# Patient Record
Sex: Male | Born: 1990 | Race: Black or African American | Hispanic: No | Marital: Single | Smoking: Current every day smoker
Health system: Southern US, Community
[De-identification: ages and names within clinical notes are randomized; demographics above are authoritative.]

## PROBLEM LIST (undated history)

## (undated) DIAGNOSIS — J45909 Unspecified asthma, uncomplicated: Secondary | ICD-10-CM

## (undated) HISTORY — PX: AMPUTATION FINGER: SHX6594

---
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
## 2011-03-22 ENCOUNTER — Emergency Department: Payer: Self-pay | Admitting: Unknown Physician Specialty

## 2015-03-25 ENCOUNTER — Emergency Department
Admit: 2015-03-25 | Disposition: A | Discharge status: Home / Self Care | Payer: Self-pay | Admitting: Emergency Medicine

## 2015-12-31 ENCOUNTER — Emergency Department
Admission: EM | Admit: 2015-12-31 | Discharge: 2015-12-31 | Disposition: A | Discharge status: Home / Self Care | Payer: 59 | Attending: Emergency Medicine | Admitting: Emergency Medicine

## 2015-12-31 ENCOUNTER — Encounter: Payer: Self-pay | Admitting: *Deleted

## 2015-12-31 ENCOUNTER — Emergency Department: Payer: 59

## 2015-12-31 DIAGNOSIS — J45901 Unspecified asthma with (acute) exacerbation: Secondary | ICD-10-CM | POA: Insufficient documentation

## 2015-12-31 DIAGNOSIS — F17200 Nicotine dependence, unspecified, uncomplicated: Secondary | ICD-10-CM | POA: Diagnosis not present

## 2015-12-31 DIAGNOSIS — J069 Acute upper respiratory infection, unspecified: Secondary | ICD-10-CM

## 2015-12-31 DIAGNOSIS — R0989 Other specified symptoms and signs involving the circulatory and respiratory systems: Secondary | ICD-10-CM

## 2015-12-31 DIAGNOSIS — J989 Respiratory disorder, unspecified: Secondary | ICD-10-CM

## 2015-12-31 DIAGNOSIS — R0602 Shortness of breath: Secondary | ICD-10-CM | POA: Diagnosis present

## 2015-12-31 HISTORY — DX: Unspecified asthma, uncomplicated: J45.909

## 2015-12-31 MED ORDER — IPRATROPIUM-ALBUTEROL 0.5-2.5 (3) MG/3ML IN SOLN
3.0000 mL | Freq: Once | RESPIRATORY_TRACT | Status: AC
  Administered 2015-12-31: 3 mL via RESPIRATORY_TRACT
  Filled 2015-12-31: qty 3

## 2015-12-31 MED ORDER — ALBUTEROL SULFATE HFA 108 (90 BASE) MCG/ACT IN AERS: 2.0000 | INHALATION_SPRAY | Freq: Four times a day (QID) | RESPIRATORY_TRACT | Status: AC | PRN

## 2015-12-31 MED ORDER — GUAIFENESIN-CODEINE 100-10 MG/5ML PO SOLN: 10.0000 mL | ORAL | Status: DC | PRN

## 2015-12-31 NOTE — ED Provider Notes (Signed)
[facility] Emergency Department Provider Note  ____________________________________________  Time seen: Approximately 10:28 AM  I have reviewed the triage vital signs and the nursing notes.   HISTORY  Chief Complaint Shortness of Breath   HPI Devin Ingram is a 25 y.o. male who presents with 5 days of shortness of breath and chest pressure. He reports that on Friday he developed new symptoms along with wheezing, congestion, chills, and cough. He reports that the cough exacerbates his chest pain. He rates his chest pain as 5/10. He denies ear pain, sore throat, or fever. He also reports that Sunday night he had a productive cough with blood-tinged sputum. He has taken cold and flu medicine and used his son's albuterol inhaler with minimal improvement.  Past Medical History  Diagnosis Date  . Asthma     There are no active problems to display for this patient.   History reviewed. No pertinent past surgical history.  Current Outpatient Rx  Name  Route  Sig  Dispense  Refill  . albuterol (PROVENTIL HFA;VENTOLIN HFA) 108 (90 Base) MCG/ACT inhaler   Inhalation   Inhale 2 puffs into the lungs every 6 (six) hours as needed for wheezing or shortness of breath.   1 Inhaler   2   . guaiFENesin-codeine 100-10 MG/5ML syrup   Oral   Take 10 mLs by mouth every 4 (four) hours as needed for cough.   180 mL   0     Allergies Review of patient's allergies indicates no known allergies.  No family history on file.  Social History Social History  Substance Use Topics  . Smoking status: Current Every Day Smoker  . Smokeless tobacco: None  . Alcohol Use: No    Review of Systems Constitutional: No fever. Positive chills Eyes: No visual changes. ENT: No sore throat. Positive rhinorrhea, positive nasal congestion, no otalgia, no otorrhea Cardiovascular: Positive chest pain. Respiratory: Positive shortness of breath. Positive cough. Positive sputum  production with blood tinged. Gastrointestinal: No abdominal pain.  No nausea, no vomiting.  No diarrhea.  No constipation. Genitourinary: Negative for dysuria. Negative frequency or hesitancy. Musculoskeletal: Muscle pain in lower abdomen with cough Neurological: Negative for headaches 10-point ROS otherwise negative.  ____________________________________________   PHYSICAL EXAM:  VITAL SIGNS: ED Triage Vitals  Enc Vitals Group     BP 12/31/15 0928 139/78 mmHg     Pulse Rate 12/31/15 0928 65     Resp 12/31/15 0928 24     Temp 12/31/15 0928 97.6 F (36.4 C)     Temp Source 12/31/15 0928 Oral     SpO2 12/31/15 0928 96 %     Weight 12/31/15 0928 135 lb (61.236 kg)     Height 12/31/15 0928  (1.702 m)     Head Cir --      Peak Flow --      Pain Score 12/31/15 0929 5     Pain Loc --      Pain Edu? --      Excl. in GC? --     Constitutional: Alert and oriented. Well appearing and in no acute distress. Eyes: Conjunctivae are normal. PERRL. EOMI. Head: Atraumatic. Nose: No congestion/rhinnorhea. Mouth/Throat: Mucous membranes are moist.  Oropharynx non-erythematous.  Hematological/Lymphatic/Immunilogical: No cervical lymphadenopathy. Cardiovascular: Normal rate, regular rhythm. Grossly normal heart sounds.  Good peripheral circulation. Respiratory: Normal respiratory effort.  No retractions. Rare expiratory rhonchi. Productive cough noted while in exam room Gastrointestinal: Soft and nontender. No distention. No abdominal  bruits. No CVA tenderness. Musculoskeletal: No lower extremity tenderness nor edema.   Neurologic:  Normal speech and language. No gross focal neurologic deficits are appreciated.  Skin:  Skin is warm, dry and intact. No rash noted. Psychiatric: Mood and affect are normal. Speech and behavior are normal.  ____________________________________________   LABS (all labs ordered are listed, but only abnormal results are displayed)  Labs Reviewed - No data  to display ____________________________________________  EKG No acute STEMI  ____________________________________________  RADIOLOGY Lungs are clear, heart size is normal, no pneumothorax or pleural effusion. No focal bony abnormality.  ____________________________________________   PROCEDURES  Procedure(s) performed: None  Critical Care performed: No  ____________________________________________   INITIAL IMPRESSION / ASSESSMENT AND PLAN / ED COURSE  Pertinent labs & imaging results that were available during my care of the patient were reviewed by me and considered in my medical decision making (see chart for details).  Chest x-ray shows no sign of infiltrates or opacities, no acute STEMI on EKG. Patient will be treated for a viral upper respiratory tract infection, appropriately educated, and discharged home. Patient was given a DuoNeb treatment while in the ED with improvement. Rx given for albuterol inhaler, Robitussin-AC and a work excuse 1 day.  Patient voices no other emergency medical complaints at this time.  ____________________________________________   FINAL CLINICAL IMPRESSION(S) / ED DIAGNOSES  Final diagnoses:  Reactive airway disease that is not asthma  URI, acute      Evangeline Dakin, PA-C 12/31/15 1125  Arnaldo Natal, MD 12/31/15 [phone]

## 2015-12-31 NOTE — ED Notes (Signed)
Pt states SOB and cough, productive, states it has been going on since this weekend, states hx of asthma but does not use inhalers, also smoker

## 2017-02-18 ENCOUNTER — Emergency Department
Admission: EM | Admit: 2017-02-18 | Discharge: 2017-02-18 | Disposition: A | Discharge status: Home / Self Care | Payer: 59 | Attending: Student in an Organized Health Care Education/Training Program | Admitting: Student in an Organized Health Care Education/Training Program

## 2017-02-18 ENCOUNTER — Encounter: Payer: Self-pay | Admitting: Emergency Medicine

## 2017-02-18 ENCOUNTER — Emergency Department: Payer: 59

## 2017-02-18 DIAGNOSIS — S0990XA Unspecified injury of head, initial encounter: Secondary | ICD-10-CM | POA: Diagnosis present

## 2017-02-18 DIAGNOSIS — F17200 Nicotine dependence, unspecified, uncomplicated: Secondary | ICD-10-CM | POA: Insufficient documentation

## 2017-02-18 DIAGNOSIS — Y990 Civilian activity done for income or pay: Secondary | ICD-10-CM | POA: Insufficient documentation

## 2017-02-18 DIAGNOSIS — J45909 Unspecified asthma, uncomplicated: Secondary | ICD-10-CM | POA: Diagnosis not present

## 2017-02-18 DIAGNOSIS — W228XXA Striking against or struck by other objects, initial encounter: Secondary | ICD-10-CM | POA: Insufficient documentation

## 2017-02-18 DIAGNOSIS — Z79899 Other long term (current) drug therapy: Secondary | ICD-10-CM | POA: Diagnosis not present

## 2017-02-18 DIAGNOSIS — S0093XA Contusion of unspecified part of head, initial encounter: Secondary | ICD-10-CM | POA: Diagnosis not present

## 2017-02-18 DIAGNOSIS — Y929 Unspecified place or not applicable: Secondary | ICD-10-CM | POA: Insufficient documentation

## 2017-02-18 DIAGNOSIS — Y9389 Activity, other specified: Secondary | ICD-10-CM | POA: Diagnosis not present

## 2017-02-18 NOTE — ED Notes (Signed)
Per patient, patient does not want to file under worker's comp. He wants to file under own insurance.

## 2017-02-18 NOTE — ED Provider Notes (Signed)
[facility] Emergency Department Provider Note   ____________________________________________   None    (approximate)  I have reviewed the triage vital signs and the nursing notes.   HISTORY  Chief Complaint Head Injury    HPI Devin Ingram is a 26 y.o. male patient complaining left. Pain behind left ear secondary to blunt trauma at work yesterday. States hit his head on a metal pipe at work but denies LOC. Patient denies vision disturbance vertical.. Patient denies nausea vomiting. No palliative measures taken for this complaint. Patient rates his pain as a 4/10. Patient described a pain as "achy". Patient state incident occurred approximately 24 hours ago.   Past Medical History:  Diagnosis Date  . Asthma     There are no active problems to display for this patient.   History reviewed. No pertinent surgical history.  Prior to Admission medications   Medication Sig Start Date End Date Taking? Authorizing Provider  albuterol (PROVENTIL HFA;VENTOLIN HFA) 108 (90 Base) MCG/ACT inhaler Inhale 2 puffs into the lungs every 6 (six) hours as needed for wheezing or shortness of breath. 12/31/15   Charmayne Sheerharles M Beers, PA-C  guaiFENesin-codeine 100-10 MG/5ML syrup Take 10 mLs by mouth every 4 (four) hours as needed for cough. 12/31/15   Evangeline Dakinharles M Beers, PA-C    Allergies Patient has no known allergies.  No family history on file.  Social History Social History  Substance Use Topics  . Smoking status: Current Every Day Smoker  . Smokeless tobacco: Not on file  . Alcohol use No    Review of Systems Constitutional: No fever/chills Eyes: No visual changes. ENT: No sore throat. Cardiovascular: Denies chest pain. Respiratory: Denies shortness of breath. Gastrointestinal: No abdominal pain.  No nausea, no vomiting.  No diarrhea.  No constipation. Genitourinary: Negative for dysuria. Musculoskeletal: Negative for back pain. Skin: Negative for  rash. Neurological: Negative for headaches, focal weakness or numbness. ____________________________________________   PHYSICAL EXAM:  VITAL SIGNS: ED Triage Vitals  Enc Vitals Group     BP 02/18/17 1141 135/60     Pulse Rate 02/18/17 1141 69     Resp 02/18/17 1141 17     Temp 02/18/17 1141 97.5 F (36.4 C)     Temp Source 02/18/17 1141 Oral     SpO2 02/18/17 1141 100 %     Weight 02/18/17 1142 140 lb (63.5 kg)     Height 02/18/17 1142 5\' 6"  (1.676 m)     Head Circumference --      Peak Flow --      Pain Score 02/18/17 1142 4     Pain Loc --      Pain Edu? --      Excl. in GC? --     Constitutional: Alert and oriented. Well appearing and in no acute distress. Eyes: Conjunctivae are normal. PERRL. EOMI. Head: Atraumatic. Nose: No congestion/rhinnorhea. Mouth/Throat: Mucous membranes are moist.  Oropharynx non-erythematous. Neck: No stridor.  No cervical spine tenderness to palpation. Hematological/Lymphatic/Immunilogical: No cervical lymphadenopathy. Cardiovascular: Normal rate, regular rhythm. Grossly normal heart sounds.  Good peripheral circulation. Respiratory: Normal respiratory effort.  No retractions. Lungs CTAB. Gastrointestinal: Soft and nontender. No distention. No abdominal bruits. No CVA tenderness. Musculoskeletal: No lower extremity tenderness nor edema.  No joint effusions. Neurologic:  Normal speech and language. No gross focal neurologic deficits are appreciated. No gait instability. Skin:  Skin is warm, dry and intact. No rash noted. Psychiatric: Mood and affect are normal. Speech and behavior  are normal.  ____________________________________________   LABS (all labs ordered are listed, but only abnormal results are displayed)  Labs Reviewed - No data to  display ____________________________________________  EKG   ____________________________________________  RADIOLOGY   ____________________________________________   PROCEDURES  Procedure(s) performed: None  Procedures  Critical Care performed: No  ____________________________________________   INITIAL IMPRESSION / ASSESSMENT AND PLAN / ED COURSE  Pertinent labs & imaging results that were available during my care of the patient were reviewed by me and considered in my medical decision making (see chart for details).  Minor head injuries secondary to contusion. Discussed negative x-ray finding with patient. Patient given discharge care instructions. Patient given a work note for today. Advised follow-up family doctor this complaint persists.      ____________________________________________   FINAL CLINICAL IMPRESSION(S) / ED DIAGNOSES  Final diagnoses:  Head contusion  Minor head injury, initial encounter      NEW MEDICATIONS STARTED DURING THIS VISIT:  New Prescriptions   No medications on file     Note:  This document was prepared using Dragon voice recognition software and may include unintentional dictation errors.    Joni Reining, PA-C 02/18/17 1251    Willy Eddy, MD 02/18/17 1345

## 2017-02-18 NOTE — ED Notes (Signed)
Attempted to call patients employer, [employer], in regards to requirements of workers comp. [phone]. No answer at this time.

## 2017-02-18 NOTE — Discharge Instructions (Signed)
OTC Motrin as needed for Headache.

## 2017-02-18 NOTE — ED Triage Notes (Signed)
Patient presents to ED via POV with c/o head injury that he sustained at work yesterday. Patient states he hit his head on some metal at work yesterday. Denies LOC or taking blood thinners. Patient is c/o left head pain behind ear. Patient requesting to file WC. Patient neurologically intact. No deficits noted. A&O x4. GCS 15.

## 2017-03-04 ENCOUNTER — Emergency Department
Admission: EM | Admit: 2017-03-04 | Discharge: 2017-03-04 | Disposition: A | Discharge status: Home / Self Care | Payer: 59 | Attending: Emergency Medicine | Admitting: Emergency Medicine

## 2017-03-04 ENCOUNTER — Encounter: Payer: Self-pay | Admitting: Medical Oncology

## 2017-03-04 DIAGNOSIS — Z79899 Other long term (current) drug therapy: Secondary | ICD-10-CM | POA: Diagnosis not present

## 2017-03-04 DIAGNOSIS — J45909 Unspecified asthma, uncomplicated: Secondary | ICD-10-CM | POA: Diagnosis not present

## 2017-03-04 DIAGNOSIS — Y92410 Unspecified street and highway as the place of occurrence of the external cause: Secondary | ICD-10-CM | POA: Diagnosis not present

## 2017-03-04 DIAGNOSIS — S39012A Strain of muscle, fascia and tendon of lower back, initial encounter: Secondary | ICD-10-CM | POA: Diagnosis not present

## 2017-03-04 DIAGNOSIS — F17200 Nicotine dependence, unspecified, uncomplicated: Secondary | ICD-10-CM | POA: Diagnosis not present

## 2017-03-04 DIAGNOSIS — Y999 Unspecified external cause status: Secondary | ICD-10-CM | POA: Diagnosis not present

## 2017-03-04 DIAGNOSIS — Y9389 Activity, other specified: Secondary | ICD-10-CM | POA: Insufficient documentation

## 2017-03-04 DIAGNOSIS — S3992XA Unspecified injury of lower back, initial encounter: Secondary | ICD-10-CM | POA: Diagnosis present

## 2017-03-04 MED ORDER — METHOCARBAMOL 500 MG PO TABS: 500.0000 mg | ORAL_TABLET | Freq: Four times a day (QID) | ORAL | 0 refills | Status: DC

## 2017-03-04 MED ORDER — IBUPROFEN 600 MG PO TABS: 600.0000 mg | ORAL_TABLET | Freq: Three times a day (TID) | ORAL | 0 refills | Status: DC | PRN
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
## 2017-03-04 NOTE — Discharge Instructions (Signed)
Follow-up with [facility] if any continued problems. Use ice or heat to your back as needed for comfort. Ibuprofen 600 mg 3 times a day with food. Robaxin one 4 times a day for the next 3 days if needed for muscle spasms. Robaxin can cause drowsiness so do not take this medication drive or operate machinery.
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
## 2017-10-01 ENCOUNTER — Emergency Department
Admission: EM | Admit: 2017-10-01 | Discharge: 2017-10-01 | Disposition: A | Discharge status: Home / Self Care | Payer: 59 | Attending: Emergency Medicine | Admitting: Emergency Medicine

## 2017-10-01 DIAGNOSIS — Y998 Other external cause status: Secondary | ICD-10-CM | POA: Insufficient documentation

## 2017-10-01 DIAGNOSIS — Y9389 Activity, other specified: Secondary | ICD-10-CM | POA: Insufficient documentation

## 2017-10-01 DIAGNOSIS — J45909 Unspecified asthma, uncomplicated: Secondary | ICD-10-CM | POA: Insufficient documentation

## 2017-10-01 DIAGNOSIS — S46912A Strain of unspecified muscle, fascia and tendon at shoulder and upper arm level, left arm, initial encounter: Secondary | ICD-10-CM

## 2017-10-01 DIAGNOSIS — X500XXA Overexertion from strenuous movement or load, initial encounter: Secondary | ICD-10-CM | POA: Insufficient documentation

## 2017-10-01 DIAGNOSIS — Y929 Unspecified place or not applicable: Secondary | ICD-10-CM | POA: Insufficient documentation

## 2017-10-01 DIAGNOSIS — F17200 Nicotine dependence, unspecified, uncomplicated: Secondary | ICD-10-CM | POA: Insufficient documentation

## 2017-10-01 MED ORDER — CYCLOBENZAPRINE HCL 5 MG PO TABS: 5.0000 mg | ORAL_TABLET | Freq: Three times a day (TID) | ORAL | 0 refills | Status: DC | PRN

## 2017-10-01 MED ORDER — HYDROCODONE-ACETAMINOPHEN 5-325 MG PO TABS: 1.0000 | ORAL_TABLET | Freq: Four times a day (QID) | ORAL | 0 refills | Status: DC | PRN

## 2017-10-01 MED ORDER — KETOROLAC TROMETHAMINE 30 MG/ML IJ SOLN
30.0000 mg | Freq: Once | INTRAMUSCULAR | Status: AC
  Administered 2017-10-01: 30 mg via INTRAMUSCULAR
  Filled 2017-10-01: qty 1

## 2017-10-01 MED ORDER — NAPROXEN 500 MG PO TABS: 500.0000 mg | ORAL_TABLET | Freq: Two times a day (BID) | ORAL | 0 refills | Status: DC

## 2017-10-01 MED ORDER — HYDROCODONE-ACETAMINOPHEN 5-325 MG PO TABS
1.0000 | ORAL_TABLET | Freq: Once | ORAL | Status: AC
  Administered 2017-10-01: 1 via ORAL
  Filled 2017-10-01: qty 1

## 2017-10-01 MED ORDER — CYCLOBENZAPRINE HCL 10 MG PO TABS
5.0000 mg | ORAL_TABLET | Freq: Once | ORAL | Status: AC
  Administered 2017-10-01: 5 mg via ORAL
  Filled 2017-10-01: qty 1

## 2017-10-01 NOTE — ED Notes (Signed)
Pt discharged to home.  Family member driving.  Discharge instructions reviewed.  Verbalized understanding.  No questions or concerns at this time.  Teach back verified.  Pt in NAD.  No items left in ED.   

## 2017-10-01 NOTE — ED Notes (Addendum)
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  Pt reports now that when he moves his next to the right he feels a shooting pain down his neck into the left side of his chest and left shoulder.  Pt denies pain when neck is in neutral position.  Pain is not worse on palpation.  Pt denies taking anything for pain at home since he hurt shoulder.  Pt denies using a heating pad, but states that he tried an ice pack.

## 2017-10-01 NOTE — ED Provider Notes (Signed)
[facility] Emergency Department Provider Note   ____________________________________________   First MD Initiated Contact with Patient 10/01/17 1121     (approximate)  I have reviewed the triage vital signs and the nursing notes.   HISTORY  Chief Complaint Arm Pain   HPI Devin FavreJeffrey D Ingram is a 26 y.o. male is here complaining of left shoulder painfor the past 2 weeks. Patient states that he was helping a friend by lifting him to "pop his back". He began having pain in his left shoulder after lifting this person. Patient states that pain increases with range of motion. He has not taken any over-the-counter medication but has been "dealing with pain". Patient denies any paresthesias to his extremity. He denies any previous injury to his arm.currently he rates pain as 10 over 10.  Past Medical History:  Diagnosis Date  . Asthma     There are no active problems to display for this patient.   History reviewed. No pertinent surgical history.  Prior to Admission medications   Medication Sig Start Date End Date Taking? Authorizing Provider  albuterol (PROVENTIL HFA;VENTOLIN HFA) 108 (90 Base) MCG/ACT inhaler Inhale 2 puffs into the lungs every 6 (six) hours as needed for wheezing or shortness of breath. 12/31/15   Beers, Charmayne Sheerharles M, PA-C  cyclobenzaprine (FLEXERIL) 5 MG tablet Take 1 tablet (5 mg total) by mouth 3 (three) times daily as needed for muscle spasms. 10/01/17   Tommi RumpsSummers, Annalei Friesz L, PA-C  HYDROcodone-acetaminophen (NORCO/VICODIN) 5-325 MG tablet Take 1 tablet by mouth every 6 (six) hours as needed for moderate pain. 10/01/17   Tommi RumpsSummers, Prisma Decarlo L, PA-C  naproxen (NAPROSYN) 500 MG tablet Take 1 tablet (500 mg total) by mouth 2 (two) times daily with a meal. 10/01/17   Tommi RumpsSummers, Lorina Duffner L, PA-C    Allergies Patient has no known allergies.  No family history on file.  Social History Social History  Substance Use Topics  . Smoking status: Current Every  Day Smoker  . Smokeless tobacco: Never Used  . Alcohol use No    Review of Systems Constitutional: No fever/chills Cardiovascular: Denies chest pain. Respiratory: Denies shortness of breath. Gastrointestinal:   No nausea, no vomiting.  Musculoskeletal: Negative for back pain.positive for left shoulder pain. Skin: Negative for rash. Neurological: Negative for headaches, focal weakness or numbness. ___________________________________________   PHYSICAL EXAM:  VITAL SIGNS: ED Triage Vitals  Enc Vitals Group     BP 10/01/17 1103 127/73     Pulse Rate 10/01/17 1103 61     Resp 10/01/17 1103 16     Temp 10/01/17 1103 98.1 F (36.7 C)     Temp Source 10/01/17 1103 Oral     SpO2 10/01/17 1103 99 %     Weight 10/01/17 1102 140 lb (63.5 kg)     Height 10/01/17 1102 5\' 8"  (1.727 m)     Head Circumference --      Peak Flow --      Pain Score 10/01/17 1112 10     Pain Loc --      Pain Edu? --      Excl. in GC? --    Constitutional: Alert and oriented. Well appearing and in no acute distress. Eyes: Conjunctivae are normal.  Head: Atraumatic. Neck: No stridor.  No tenderness on palpation cervical spine posteriorly. Range of motion is without restriction. Cardiovascular: Normal rate, regular rhythm. Grossly normal heart sounds.  Good peripheral circulation. Respiratory: Normal respiratory effort.  No retractions. Lungs CTAB. Musculoskeletal:  an examination of left shoulder there is no gross deformity noted. There is no soft tissue swelling, ecchymosis or abrasions. Range of motion is restricted in abduction only. There is no crepitus noted. There is also tenderness on palpation of the left trapezius muscle and slight rhomboid muscle tenderness. Good muscle strength bilaterally. Motor sensory function intact. Neurologic:  Normal speech and language. No gross focal neurologic deficits are appreciated.  Skin:  Skin is warm, dry and intact. No rash noted. Psychiatric: Mood and affect are  normal. Speech and behavior are normal.  ____________________________________________   LABS (all labs ordered are listed, but only abnormal results are displayed)  Labs Reviewed - No data to display   PROCEDURES  Procedure(s) performed: None  Procedures  Critical Care performed: No  ____________________________________________   INITIAL IMPRESSION / ASSESSMENT AND PLAN / ED COURSE   Patient was given Toradol 30 mg IM, Norco, and Flexeril 5 mg while in the department. Patient had no pain and increased range of motion prior to discharge. Patient was given a prescription for continued Flexeril, Norco, and naproxen for the next 3 days. Patient is also encouraged to use ice or heat to his shoulder as needed. He will follow-up with [facility] clinic if any continued problems.  ____________________________________________   FINAL CLINICAL IMPRESSION(S) / ED DIAGNOSES  Final diagnoses:  Muscle strain of left shoulder region, initial encounter      NEW MEDICATIONS STARTED DURING THIS VISIT:  Discharge Medication List as of 10/01/2017 12:50 PM    START taking these medications   Details  cyclobenzaprine (FLEXERIL) 5 MG tablet Take 1 tablet (5 mg total) by mouth 3 (three) times daily as needed for muscle spasms., Starting Sat 10/01/2017, Print    HYDROcodone-acetaminophen (NORCO/VICODIN) 5-325 MG tablet Take 1 tablet by mouth every 6 (six) hours as needed for moderate pain., Starting Sat 10/01/2017, Print    naproxen (NAPROSYN) 500 MG tablet Take 1 tablet (500 mg total) by mouth 2 (two) times daily with a meal., Starting Sat 10/01/2017, Print         Note:  This document was prepared using Dragon voice recognition software and may include unintentional dictation errors.    Tommi Rumps, PA-C 10/01/17 1326    Governor Rooks, MD 10/01/17 [phone]

## 2017-10-01 NOTE — ED Triage Notes (Signed)
Pt c/o left arm pain for past few weeks, reports injury while helping friend stretch back. Has not taken anything for pain relief. VS stable.

## 2017-10-01 NOTE — Discharge Instructions (Signed)
[facility] if any continued problems. Continue taking medication only as directed. Flexeril 5 mg, Norco, naproxen 500 mg. You may also use ice to your shoulder as needed for discomfort. Limited lifting with your left arm for the next 3-4 days.

## 2019-06-28 ENCOUNTER — Other Ambulatory Visit: Payer: Self-pay

## 2019-06-28 DIAGNOSIS — Z20822 Contact with and (suspected) exposure to covid-19: Secondary | ICD-10-CM

## 2019-07-01 LAB — NOVEL CORONAVIRUS, NAA: SARS-CoV-2, NAA: NOT DETECTED

## 2020-01-31 ENCOUNTER — Emergency Department
Admission: EM | Admit: 2020-01-31 | Discharge: 2020-01-31 | Disposition: A | Discharge status: Home / Self Care | Payer: Self-pay | Attending: Emergency Medicine | Admitting: Emergency Medicine

## 2020-01-31 ENCOUNTER — Other Ambulatory Visit: Payer: Self-pay

## 2020-01-31 ENCOUNTER — Encounter: Payer: Self-pay | Admitting: Emergency Medicine

## 2020-01-31 DIAGNOSIS — S61012A Laceration without foreign body of left thumb without damage to nail, initial encounter: Secondary | ICD-10-CM | POA: Insufficient documentation

## 2020-01-31 DIAGNOSIS — Y9389 Activity, other specified: Secondary | ICD-10-CM | POA: Insufficient documentation

## 2020-01-31 DIAGNOSIS — J45909 Unspecified asthma, uncomplicated: Secondary | ICD-10-CM | POA: Insufficient documentation

## 2020-01-31 DIAGNOSIS — Z87891 Personal history of nicotine dependence: Secondary | ICD-10-CM | POA: Insufficient documentation

## 2020-01-31 DIAGNOSIS — W260XXA Contact with knife, initial encounter: Secondary | ICD-10-CM | POA: Insufficient documentation

## 2020-01-31 DIAGNOSIS — Z23 Encounter for immunization: Secondary | ICD-10-CM | POA: Insufficient documentation

## 2020-01-31 DIAGNOSIS — Y999 Unspecified external cause status: Secondary | ICD-10-CM | POA: Insufficient documentation

## 2020-01-31 DIAGNOSIS — Y929 Unspecified place or not applicable: Secondary | ICD-10-CM | POA: Insufficient documentation

## 2020-01-31 MED ORDER — LIDOCAINE HCL (PF) 1 % IJ SOLN
INTRAMUSCULAR | Status: AC
  Administered 2020-01-31: 5 mL
  Filled 2020-01-31: qty 5

## 2020-01-31 MED ORDER — TETANUS-DIPHTH-ACELL PERTUSSIS 5-2.5-18.5 LF-MCG/0.5 IM SUSP
0.5000 mL | Freq: Once | INTRAMUSCULAR | Status: AC
  Administered 2020-01-31: 11:00:00 0.5 mL via INTRAMUSCULAR
  Filled 2020-01-31: qty 0.5

## 2020-01-31 MED ORDER — LIDOCAINE HCL (PF) 1 % IJ SOLN
5.0000 mL | Freq: Once | INTRAMUSCULAR | Status: AC
Start: 2020-01-31 — End: 2020-01-31
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
## 2020-01-31 NOTE — Discharge Instructions (Signed)
Follow discharge care instruction and wear splint for 3 to 5 days as needed. 

## 2020-01-31 NOTE — ED Triage Notes (Signed)
Patient to ER for c/o laceration to left thumb approx 0.5 inches with bleeding present. Patient states he was trying to open and fix a toy and cut finger with a knife.

## 2020-01-31 NOTE — ED Notes (Signed)
See triage note  Presents with laceration to left thumb  States he was trying to open a toy for his child   Knife slipped   Laceration to left thumb

## 2020-08-13 ENCOUNTER — Emergency Department (HOSPITAL_COMMUNITY)
Admission: EM | Admit: 2020-08-13 | Discharge: 2020-08-13 | Disposition: A | Discharge status: Home / Self Care | Payer: Self-pay | Attending: Emergency Medicine | Admitting: Emergency Medicine

## 2020-08-13 ENCOUNTER — Encounter (HOSPITAL_COMMUNITY): Payer: Self-pay | Admitting: Emergency Medicine

## 2020-08-13 DIAGNOSIS — Z23 Encounter for immunization: Secondary | ICD-10-CM | POA: Insufficient documentation

## 2020-08-13 DIAGNOSIS — L02411 Cutaneous abscess of right axilla: Secondary | ICD-10-CM | POA: Insufficient documentation

## 2020-08-13 DIAGNOSIS — Z87891 Personal history of nicotine dependence: Secondary | ICD-10-CM | POA: Insufficient documentation

## 2020-08-13 DIAGNOSIS — J45909 Unspecified asthma, uncomplicated: Secondary | ICD-10-CM | POA: Insufficient documentation

## 2020-08-13 MED ORDER — LIDOCAINE HCL (PF) 1 % IJ SOLN
5.0000 mL | Freq: Once | INTRAMUSCULAR | Status: DC
  Filled 2020-08-13: qty 5

## 2020-08-13 MED ORDER — TETANUS-DIPHTH-ACELL PERTUSSIS 5-2.5-18.5 LF-MCG/0.5 IM SUSP
0.5000 mL | Freq: Once | INTRAMUSCULAR | Status: AC
  Administered 2020-08-13: 0.5 mL via INTRAMUSCULAR
  Filled 2020-08-13: qty 0.5

## 2020-08-13 NOTE — ED Triage Notes (Signed)
Pt reports he has what he thought was an ingrown hair to R axilla, states that is has gotten much larger in the past few days. Some blood draining from area.

## 2020-08-13 NOTE — ED Provider Notes (Signed)
[facility] EMERGENCY DEPARTMENT Provider Note   CSN: [id number] Arrival date & time: 08/13/20  1227     History Chief Complaint  Patient presents with  . Abscess    Devin Ingram is a 29 y.o. male.  HPI    Patient with significant medical history of asthma presents with a abscess underneath his armpit on the right side.  Patient states he noticed a small ingrown hair 2 weeks ago. He remove the hair and a small boil appeared.  The boil got larger and larger and became more painful.  He noticed today there is some discharge coming from it describes as a yellow substance.  He denies ever experienced this before and denies any alleviating or aggravating factors.  Patient denies headache, fever, chills, shortness of breath, abdominal pain, nausea, vomiting, diarrhea, pedal edema.  Past Medical History:  Diagnosis Date  . Asthma     There are no problems to display for this patient.   Past Surgical History:  Procedure Laterality Date  . AMPUTATION FINGER         No family history on file.  Social History   Tobacco Use  . Smoking status: Former Games developer  . Smokeless tobacco: Never Used  Substance Use Topics  . Alcohol use: No  . Drug use: Not on file    Home Medications Prior to Admission medications   Medication Sig Start Date End Date Taking? Authorizing Provider  albuterol (PROVENTIL HFA;VENTOLIN HFA) 108 (90 Base) MCG/ACT inhaler Inhale 2 puffs into the lungs every 6 (six) hours as needed for wheezing or shortness of breath. 12/31/15   Beers, Charmayne Sheer, PA-C    Allergies    Patient has no known allergies.  Review of Systems   Review of Systems  Constitutional: Negative for chills and fever.  HENT: Negative for congestion, sore throat and tinnitus.   Eyes: Negative for visual disturbance.  Respiratory: Negative for shortness of breath.   Cardiovascular: Negative for chest pain.  Gastrointestinal: Negative for abdominal pain, diarrhea, nausea  and vomiting.  Genitourinary: Negative for enuresis.  Musculoskeletal: Negative for back pain.  Skin: Negative for rash.       Admits to a boil on the right side of his armpit.  Neurological: Negative for dizziness.  Hematological: Does not bruise/bleed easily.    Physical Exam Updated Vital Signs BP (!) 154/91 (BP Location: Right Arm)   Pulse 60   Temp 98.3 F (36.8 C) (Oral)   Resp 16   SpO2 99%   Physical Exam Vitals and nursing note reviewed.  Constitutional:      General: He is not in acute distress.    Appearance: Normal appearance. He is not ill-appearing or diaphoretic.  HENT:     Head: Normocephalic and atraumatic.     Nose: No congestion or rhinorrhea.  Eyes:     General: No scleral icterus.       Right eye: No discharge.        Left eye: No discharge.     Conjunctiva/sclera: Conjunctivae normal.  Pulmonary:     Effort: Pulmonary effort is normal. No respiratory distress.     Breath sounds: Normal breath sounds. No wheezing.  Musculoskeletal:     Cervical back: Neck supple.     Right lower leg: No edema.     Left lower leg: No edema.  Skin:    General: Skin is warm and dry.     Coloration: Skin is not jaundiced or pale.  Findings: Lesion present.     Comments: Skin exam was performed, there is a large 4 cm in diameter abscess noted on the right axilla.  It was nonerythematous, warm to the touch, purulent discharge noted.  It was tender to palpation, fluctuance felt no induration.  No signs of overlying cellulitis noted.  Neurological:     Mental Status: He is alert and oriented to person, place, and time.  Psychiatric:        Mood and Affect: Mood normal.       ED Results / Procedures / Treatments   Labs (all labs ordered are listed, but only abnormal results are displayed) Labs Reviewed - No data to display  EKG None  Radiology No results found.  Procedures .Marland KitchenIncision and Drainage  Date/Time: 08/13/2020 2:42 PM Performed by: Carroll Sage, PA-C Authorized by: Carroll Sage, PA-C   Consent:    Consent obtained:  Verbal   Consent given by:  Patient   Risks discussed:  Bleeding, incomplete drainage, pain, damage to other organs and infection   Alternatives discussed:  No treatment Location:    Type:  Abscess   Size:  4cm   Location:  Upper extremity   Upper extremity location:  Arm   Arm location:  R upper arm Pre-procedure details:    Skin preparation:  Betadine Anesthesia (see MAR for exact dosages):    Anesthesia method:  Local infiltration   Local anesthetic:  Lidocaine 1% w/o epi Procedure type:    Complexity:  Complex Procedure details:    Incision types:  Single straight   Incision depth:  Dermal   Scalpel blade:  11   Wound management:  Probed and deloculated and irrigated with saline   Drainage:  Purulent   Drainage amount:  Moderate   Wound treatment:  Wound left open   Packing materials:  1/2 in gauze   Amount 1/2":  4cm Post-procedure details:    Patient tolerance of procedure:  Tolerated well, no immediate complications   (including critical care time)  Medications Ordered in ED Medications  lidocaine (PF) (XYLOCAINE) 1 % injection 5 mL (has no administration in time range)  Tdap (BOOSTRIX) injection 0.5 mL (0.5 mLs Intramuscular Given 08/13/20 1436)    ED Course  I have reviewed the triage vital signs and the nursing notes.  Pertinent labs & imaging results that were available during my care of the patient were reviewed by me and considered in my medical decision making (see chart for details).    MDM Rules/Calculators/A&P                          Patient presents with abscess under his right axilla. Patient was alert and oriented did not appear to be in acute distress, vital signs reassuring. Patient's right axilla was evaluated, there is a 4 cm in diameter abscess noted with purulent discharge present. No erythema noted, tender to palpation, no signs of overlying infection  noted. Induration and fluctuance felt. Will recommend I&D as this would be the best treatment for this patient.  I&D was performed copious amount of purulent discharge was mechanically massaged out of abscess. Large void was packed with gauze and patient was instructed patient to follow-up in 48 hours to have it removed. Patient was given a tetanus shot as he does not member the last time he had one.  Low suspicion for deep tissue abscess or overlying cellulitis as there was no erythema or  edema noted around the abscess. After draining the abscess no fluctuance or indurations felt below or around the regional abscess. Low suspicion for systemic infection as patient was nontoxic-appearing, vital signs reassuring, no obvious source of infection noted on exam.  I suspect patient had an abscess that resulted from a ingrown hair, wound was I&D and packing was placed in void. Patient was given instructions to come back in 48 hours to have the gauze removed and for a wound check.  Patient was discussed with attending who agrees with assessment and plan. Patient was stable for discharge, no indication for hospital admissions. Patient was given at home care as well as strict return precautions. Patient verbalized understanding agreed to plan. Final Clinical Impression(s) / ED Diagnoses Final diagnoses:  Abscess of axilla, right    Rx / DC Orders ED Discharge Orders    None       Carroll Sage, PA-C 08/13/20 1609    Geoffery Lyons, MD 08/14/20 1515

## 2020-08-13 NOTE — Discharge Instructions (Signed)
You have been seen for a abscess.  Abscess was drained and packing was  placed.  I need you to remove packing in the next 48 hours. you may go to an urgent care or emergency department.  It is important that you do so as the packing need to remove  and the wound checked.  Please soak the wound 2 times a day and change bandaging frequently.  We want the wound to drain out it is important for bacteria to drain out of your arm.  You may take over-the-counter pain medication like ibuprofen and/or Tylenol as needed every 6 hours.  Please follow dosing the back of bottle.  I want to come back to emergency department if you develop fevers, chills, shortness of breath, chest pain, uncontrolled nausea, vomiting, diarrhea as these symptoms require further evaluation management.

## 2020-08-31 ENCOUNTER — Other Ambulatory Visit: Payer: Self-pay

## 2020-08-31 ENCOUNTER — Emergency Department: Payer: Self-pay

## 2020-08-31 DIAGNOSIS — Z87891 Personal history of nicotine dependence: Secondary | ICD-10-CM | POA: Insufficient documentation

## 2020-08-31 DIAGNOSIS — S42102A Fracture of unspecified part of scapula, left shoulder, initial encounter for closed fracture: Secondary | ICD-10-CM | POA: Insufficient documentation

## 2020-08-31 DIAGNOSIS — S0081XA Abrasion of other part of head, initial encounter: Secondary | ICD-10-CM | POA: Insufficient documentation

## 2020-08-31 DIAGNOSIS — Y92410 Unspecified street and highway as the place of occurrence of the external cause: Secondary | ICD-10-CM | POA: Insufficient documentation

## 2020-08-31 DIAGNOSIS — Z7951 Long term (current) use of inhaled steroids: Secondary | ICD-10-CM | POA: Insufficient documentation

## 2020-08-31 DIAGNOSIS — S2232XA Fracture of one rib, left side, initial encounter for closed fracture: Secondary | ICD-10-CM | POA: Insufficient documentation

## 2020-08-31 DIAGNOSIS — J45909 Unspecified asthma, uncomplicated: Secondary | ICD-10-CM | POA: Insufficient documentation

## 2020-08-31 DIAGNOSIS — R519 Headache, unspecified: Secondary | ICD-10-CM | POA: Insufficient documentation
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
## 2020-09-01 ENCOUNTER — Emergency Department
Admission: EM | Admit: 2020-09-01 | Discharge: 2020-09-01 | Disposition: A | Discharge status: Home / Self Care | Payer: Self-pay | Attending: Emergency Medicine | Admitting: Emergency Medicine

## 2020-09-01 ENCOUNTER — Emergency Department: Payer: Self-pay

## 2020-09-01 ENCOUNTER — Encounter: Payer: Self-pay | Admitting: Radiology

## 2020-09-01 DIAGNOSIS — S42102A Fracture of unspecified part of scapula, left shoulder, initial encounter for closed fracture: Secondary | ICD-10-CM

## 2020-09-01 DIAGNOSIS — S2232XA Fracture of one rib, left side, initial encounter for closed fracture: Secondary | ICD-10-CM

## 2020-09-01 DIAGNOSIS — T07XXXA Unspecified multiple injuries, initial encounter: Secondary | ICD-10-CM

## 2020-09-01 MED ORDER — MORPHINE SULFATE (PF) 4 MG/ML IV SOLN
4.0000 mg | Freq: Once | INTRAVENOUS | Status: AC
  Administered 2020-09-01: 4 mg via INTRAVENOUS
  Filled 2020-09-01: qty 1

## 2020-09-01 MED ORDER — OXYCODONE-ACETAMINOPHEN 5-325 MG PO TABS: 2.0000 | ORAL_TABLET | Freq: Four times a day (QID) | ORAL | 0 refills | Status: AC | PRN

## 2020-09-01 MED ORDER — DOCUSATE SODIUM 100 MG PO CAPS: ORAL_CAPSULE | ORAL | 0 refills | Status: AC

## 2020-09-01 MED ORDER — IOHEXOL 300 MG/ML  SOLN
75.0000 mL | Freq: Once | INTRAMUSCULAR | Status: AC | PRN
  Administered 2020-09-01: 75 mL via INTRAVENOUS

## 2020-09-01 MED ORDER — SODIUM CHLORIDE 0.9 % IV BOLUS
500.0000 mL | Freq: Once | INTRAVENOUS | Status: AC
  Administered 2020-09-01: 500 mL via INTRAVENOUS

## 2020-09-01 MED ORDER — ONDANSETRON HCL 4 MG/2ML IJ SOLN
4.0000 mg | INTRAMUSCULAR | Status: AC
  Administered 2020-09-01: 4 mg via INTRAVENOUS
  Filled 2020-09-01: qty 2

## 2020-09-01 NOTE — Discharge Instructions (Addendum)
As we discussed, your work-up was reassuring except for the rib fracture and scapular fracture on the left side.  This is a painful injury that will take some time to heal.  I recommend that you call the number provided for orthopedic surgery and schedule the next available follow-up appointment which will probably be in about 1 week.  In the meantime use the shoulder immobilizer to minimize the amount of movement in your left arm and shoulder.  Use over-the-counter ibuprofen and/or Tylenol as needed according to label instructions. Take Percocet as prescribed for severe pain. Do not drink alcohol, drive or participate in any other potentially dangerous activities while taking this medication as it may make you sleepy. Do not take this medication with any other sedating medications, either prescription or over-the-counter. If you were prescribed Percocet or Vicodin, do not take these with acetaminophen (Tylenol) as it is already contained within these medications.   This medication is an opiate (or narcotic) pain medication and can be habit forming.  Use it as little as possible to achieve adequate pain control.  Do not use or use it with extreme caution if you have a history of opiate abuse or dependence.  If you are on a pain contract with your primary care doctor or a pain specialist, be sure to let them know you were prescribed this medication today from the [facility].  This medication is intended for your use only - do not give any to anyone else and keep it in a secure place where nobody else, especially children, have access to it.  It will also cause or worsen constipation, so you may want to consider taking an over-the-counter stool softener while you are taking this medication.

## 2020-09-01 NOTE — ED Notes (Signed)
Xeroform gauze w/nonadhesive and ABD pad applied to L hip and L shoulder abrasions.
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
## 2020-09-01 NOTE — ED Notes (Signed)
Patient to CT via stretcher.

## 2020-09-01 NOTE — ED Notes (Signed)
Pt unable to sign. Pt verbalized understanding of d/c instructions and has no further questions at this time.   Pt to home with family at this time.
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
## 2022-08-31 ENCOUNTER — Other Ambulatory Visit: Payer: Self-pay

## 2022-08-31 ENCOUNTER — Emergency Department
Admission: EM | Admit: 2022-08-31 | Discharge: 2022-08-31 | Disposition: A | Discharge status: Home / Self Care | Payer: Self-pay | Attending: Emergency Medicine | Admitting: Emergency Medicine

## 2022-08-31 ENCOUNTER — Encounter: Payer: Self-pay | Admitting: Emergency Medicine

## 2022-08-31 ENCOUNTER — Emergency Department: Payer: Self-pay

## 2022-08-31 DIAGNOSIS — J4521 Mild intermittent asthma with (acute) exacerbation: Secondary | ICD-10-CM | POA: Insufficient documentation

## 2022-08-31 DIAGNOSIS — Z20822 Contact with and (suspected) exposure to covid-19: Secondary | ICD-10-CM | POA: Insufficient documentation

## 2022-08-31 LAB — CBC WITH DIFFERENTIAL/PLATELET
Abs Immature Granulocytes: 0.01 10*3/uL (ref 0.00–0.07)
Basophils Absolute: 0 10*3/uL (ref 0.0–0.1)
Basophils Relative: 1 %
Eosinophils Absolute: 0.7 10*3/uL — ABNORMAL HIGH (ref 0.0–0.5)
Eosinophils Relative: 13 %
HCT: 45.5 % (ref 39.0–52.0)
Hemoglobin: 15.5 g/dL (ref 13.0–17.0)
Immature Granulocytes: 0 %
Lymphocytes Relative: 21 %
Lymphs Abs: 1.2 10*3/uL (ref 0.7–4.0)
MCH: 30.2 pg (ref 26.0–34.0)
MCHC: 34.1 g/dL (ref 30.0–36.0)
MCV: 88.7 fL (ref 80.0–100.0)
Monocytes Absolute: 0.6 10*3/uL (ref 0.1–1.0)
Monocytes Relative: 10 %
Neutro Abs: 3.2 10*3/uL (ref 1.7–7.7)
Neutrophils Relative %: 55 %
Platelets: 278 10*3/uL (ref 150–400)
RBC: 5.13 MIL/uL (ref 4.22–5.81)
RDW: 12.8 % (ref 11.5–15.5)
WBC: 5.7 10*3/uL (ref 4.0–10.5)
nRBC: 0 % (ref 0.0–0.2)

## 2022-08-31 LAB — BASIC METABOLIC PANEL
Anion gap: 10 (ref 5–15)
BUN: 10 mg/dL (ref 6–20)
CO2: 27 mmol/L (ref 22–32)
Calcium: 9.8 mg/dL (ref 8.9–10.3)
Chloride: 102 mmol/L (ref 98–111)
Creatinine, Ser: 1.07 mg/dL (ref 0.61–1.24)
GFR, Estimated: >60 mL/min (ref >60)
Glucose, Bld: 95 mg/dL (ref 70–99)
Potassium: 3.9 mmol/L (ref 3.5–5.1)
Sodium: 139 mmol/L (ref 135–145)

## 2022-08-31 LAB — RESP PANEL BY RT-PCR (FLU A&B, COVID) ARPGX2
Influenza A by PCR: NEGATIVE
Influenza B by PCR: NEGATIVE
SARS Coronavirus 2 by RT PCR: NEGATIVE

## 2022-08-31 MED ORDER — ALBUTEROL SULFATE (2.5 MG/3ML) 0.083% IN NEBU: 2.5000 mg | INHALATION_SOLUTION | Freq: Four times a day (QID) | RESPIRATORY_TRACT | 0 refills | Status: AC | PRN

## 2022-08-31 MED ORDER — IPRATROPIUM-ALBUTEROL 0.5-2.5 (3) MG/3ML IN SOLN
3.0000 mL | Freq: Once | RESPIRATORY_TRACT | Status: AC
  Administered 2022-08-31: 3 mL via RESPIRATORY_TRACT
  Filled 2022-08-31: qty 3

## 2022-08-31 MED ORDER — PREDNISONE 20 MG PO TABS: 40.0000 mg | ORAL_TABLET | Freq: Every day | ORAL | 0 refills | Status: AC

## 2022-08-31 MED ORDER — ALBUTEROL SULFATE HFA 108 (90 BASE) MCG/ACT IN AERS: 2.0000 | INHALATION_SPRAY | RESPIRATORY_TRACT | 0 refills | Status: AC | PRN

## 2022-08-31 MED ORDER — PREDNISONE 20 MG PO TABS
60.0000 mg | ORAL_TABLET | Freq: Once | ORAL | Status: AC
  Administered 2022-08-31: 60 mg via ORAL
  Filled 2022-08-31: qty 3
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
## 2022-08-31 NOTE — ED Provider Notes (Signed)
[facility] Provider Note    Event Date/Time   First MD Initiated Contact with Patient 08/31/22 2128     (approximate)   History   Chief Complaint: Shortness of Breath   HPI  [name] is a 31 y.o. male with a history of asthma who comes ED complaining of shortness of breath for 2 days associated with nonproductive cough and chest tightness.  Denies fever, thinks is related to recent weather with rain and possibly increasing mold exposure.  Tried his albuterol at home without relief.     Physical Exam   Triage Vital Signs: ED Triage Vitals  Enc Vitals Group     BP 08/31/22 1934 (!) 121/91     Pulse Rate 08/31/22 1934 62     Resp 08/31/22 1934 20     Temp 08/31/22 1934 98.8 F (37.1 C)     Temp Source 08/31/22 1934 Oral     SpO2 08/31/22 1934 92 %     Weight 08/31/22 1932 145 lb (65.8 kg)     Height 08/31/22 1932 5\' 6"  (1.676 m)     Head Circumference --      Peak Flow --      Pain Score 08/31/22 1931 5     Pain Loc --      Pain Edu? --      Excl. in [place]? --     Most recent vital signs: Vitals:   08/31/22 1934  BP: (!) 121/91  Pulse: 62  Resp: 20  Temp: 98.8 F (37.1 C)  SpO2: 92%    General: Awake, no distress.  CV:  Good peripheral perfusion.  Regular rate rhythm Resp:  Normal effort.  Good air movement, diffuse expiratory wheezing. Abd:  No distention.  Other:  No lower extremity edema or calf tenderness.   ED Results / Procedures / Treatments   Labs (all labs ordered are listed, but only abnormal results are displayed) Labs Reviewed  CBC WITH DIFFERENTIAL/PLATELET - Abnormal; Notable for the following components:      Result Value   Eosinophils Absolute 0.7 (*)    All other components within normal limits  RESP PANEL BY RT-PCR (FLU A&B, COVID) ARPGX2  BASIC METABOLIC PANEL     EKG Interpreted by me Normal sinus rhythm rate of 71.  Normal axis, normal intervals.  Normal QRS ST segments and T  waves.   RADIOLOGY Chest x-ray interpreted by me, appears normal.  Radiology report reviewed   PROCEDURES:  Procedures   MEDICATIONS ORDERED IN ED: Medications  predniSONE (DELTASONE) tablet 60 mg (has no administration in time range)  ipratropium-albuterol (DUONEB) 0.5-2.5 (3) MG/3ML nebulizer solution 3 mL (3 mLs Nebulization Given 08/31/22 2000)     IMPRESSION / MDM / ASSESSMENT AND PLAN / ED COURSE  I reviewed the triage vital signs and the nursing notes.                              Differential diagnosis includes, but is not limited to, asthma exacerbation, pleural effusion, pneumonia, COVID/viral illness, anemia, electrolyte abnormality  Patient's presentation is most consistent with acute presentation with potential threat to life or bodily function.  Patient presents with shortness of breath and wheezing, consistent with asthma exacerbation.  EKG chest x-ray and labs are all reassuring.   Considering the patient's symptoms, medical history, and physical examination today, I have low suspicion for ACS, PE, TAD, pneumothorax, carditis, mediastinitis,  pneumonia, CHF, or sepsis. .  Will discharge patient with albuterol, prednisone course.       FINAL CLINICAL IMPRESSION(S) / ED DIAGNOSES   Final diagnoses:  Mild intermittent asthma with acute exacerbation     Rx / DC Orders   ED Discharge Orders          Ordered    predniSONE (DELTASONE) 20 MG tablet  Daily with breakfast        08/31/22 2141    albuterol (PROVENTIL HFA) 108 (90 Base) MCG/ACT inhaler  Every 4 hours PRN        08/31/22 2141    albuterol (PROVENTIL) (2.5 MG/3ML) 0.083% nebulizer solution  Every 6 hours PRN        08/31/22 2141             Note:  This document was prepared using Dragon voice recognition software and may include unintentional dictation errors.   Sharman Cheek, MD 09/10/22 [facility] [place]

## 2022-08-31 NOTE — ED Triage Notes (Signed)
Pt to ED from home c/o SOB since Monday with hx of asthma.  States thinks there is mold in his house and after rain the last couple days it got worse.  Non productive cough, chest tightness worse with walking, denies fevers.  No inhalers at home to use.  Manuela Schwartz, PA in room for [facility] and placing orders.
